# Patient Record
Sex: Female | Born: 2005
Health system: Southern US, Community
[De-identification: ages and names within clinical notes are randomized; demographics above are authoritative.]

## PROBLEM LIST (undated history)

## (undated) DIAGNOSIS — J40 Bronchitis, not specified as acute or chronic: Secondary | ICD-10-CM

## (undated) DIAGNOSIS — J302 Other seasonal allergic rhinitis: Secondary | ICD-10-CM

## (undated) DIAGNOSIS — Z20828 Contact with and (suspected) exposure to other viral communicable diseases: Secondary | ICD-10-CM

## (undated) DIAGNOSIS — S62109A Fracture of unspecified carpal bone, unspecified wrist, initial encounter for closed fracture: Secondary | ICD-10-CM

## (undated) DIAGNOSIS — N390 Urinary tract infection, site not specified: Secondary | ICD-10-CM

## (undated) HISTORY — PX: NOSE SURGERY: SHX723

## (undated) HISTORY — PX: TONSILLECTOMY: SUR1361

## (undated) HISTORY — DX: Bronchitis, not specified as acute or chronic: J40

## (undated) HISTORY — PX: TYMPANOSTOMY TUBE PLACEMENT: SHX32

## (undated) HISTORY — PX: ADENOIDECTOMY: SUR15

## (undated) HISTORY — PX: TONSILLECTOMY AND ADENOIDECTOMY: SUR1326

## (undated) HISTORY — PX: WISDOM TOOTH EXTRACTION: SHX21

---
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
## 2024-03-05 ENCOUNTER — Encounter (HOSPITAL_COMMUNITY): Payer: Self-pay

## 2024-03-05 ENCOUNTER — Emergency Department (HOSPITAL_COMMUNITY)

## 2024-03-05 ENCOUNTER — Other Ambulatory Visit: Payer: Self-pay

## 2024-03-05 ENCOUNTER — Emergency Department (HOSPITAL_COMMUNITY)
Admission: EM | Admit: 2024-03-05 | Discharge: 2024-03-05 | Disposition: A | Discharge status: Home / Self Care | Attending: Emergency Medicine | Admitting: Emergency Medicine

## 2024-03-05 DIAGNOSIS — M25562 Pain in left knee: Secondary | ICD-10-CM | POA: Diagnosis present

## 2024-03-05 DIAGNOSIS — S92351A Displaced fracture of fifth metatarsal bone, right foot, initial encounter for closed fracture: Secondary | ICD-10-CM | POA: Diagnosis not present

## 2024-03-05 DIAGNOSIS — S80212A Abrasion, left knee, initial encounter: Secondary | ICD-10-CM | POA: Diagnosis not present

## 2024-03-05 DIAGNOSIS — Y92480 Sidewalk as the place of occurrence of the external cause: Secondary | ICD-10-CM | POA: Insufficient documentation

## 2024-03-05 DIAGNOSIS — W101XXA Fall (on)(from) sidewalk curb, initial encounter: Secondary | ICD-10-CM | POA: Insufficient documentation

## 2024-03-05 MED ORDER — HYDROCODONE-ACETAMINOPHEN 5-325 MG PO TABS: 1.0000 | ORAL_TABLET | Freq: Four times a day (QID) | ORAL | 0 refills | Status: AC | PRN

## 2024-03-05 MED ORDER — TRIPLE ANTIBIOTIC 3.5-400-5000 EX OINT
TOPICAL_OINTMENT | Freq: Once | CUTANEOUS | Status: AC
Start: 2024-03-05 — End: 2024-03-05
  Filled 2024-03-05: qty 1

## 2024-03-05 MED ORDER — NAPROXEN 375 MG PO TABS: 375.0000 mg | ORAL_TABLET | Freq: Two times a day (BID) | ORAL | 0 refills | Status: AC

## 2024-03-05 NOTE — ED Triage Notes (Signed)
 Pt stated that she tripped off a curb at school. Abrasion to left knee and right ankle is swollen and bruised.

## 2024-03-05 NOTE — ED Provider Notes (Signed)
 [place] EMERGENCY DEPARTMENT AT [facility] Provider Note   CSN: [id number] Arrival date & time: 03/05/24  1459     Patient presents with: Anne [name] is a 18 y.o. female.   Patient is an 18 year old female who presents emergency department a chief complaint of pain to her left knee as well as to her right ankle and foot.  Patient notes that she tripped over a curb.  Patient denies striking her head or injuring her neck or back.  She does have an abrasion of the anterior aspect of her left knee.  She is up-to-date on her tetanus shot.  She has no other secondary signs of injury or pain at this time.   Fall       Prior to Admission medications   Medication Sig Start Date End Date Taking? Authorizing Provider  HYDROcodone -acetaminophen  (NORCO/VICODIN) 5-325 MG tablet Take 1 tablet by mouth every 6 (six) hours as needed for severe pain (pain score 7-10). 03/05/24  Yes Anne Lonni BIRCH, PA-C  naproxen  (NAPROSYN ) 375 MG tablet Take 1 tablet (375 mg total) by mouth 2 (two) times daily. 03/05/24  Yes Anne Lonni D, PA-C  acetaminophen  (TYLENOL ) 80 MG chewable tablet Chew 160 mg by mouth every 6 (six) hours as needed for mild pain or fever.    [provider]  albuterol  (VENTOLIN  HFA) 108 (90 Base) MCG/ACT inhaler Inhale 1-2 puffs into the lungs every 6 (six) hours as needed for wheezing or shortness of breath. 08/09/21   Devario Bucklew Savannah, PA-C  albuterol  (VENTOLIN  HFA) 108 (90 Base) MCG/ACT inhaler Inhale 1-2 puffs into the lungs every 6 (six) hours as needed for wheezing or shortness of breath. 08/16/21   Stuart Vernell Norris, PA-C  cetirizine (ZYRTEC) 10 MG chewable tablet Chew 10 mg by mouth daily as needed for allergies.     [provider]  doxycycline  (VIBRAMYCIN ) 100 MG capsule Take 1 capsule (100 mg total) by mouth 2 (two) times daily. 12/10/21   Triplett, Tammy, PA-C  ibuprofen  (ADVIL ,MOTRIN ) 50 MG chewable tablet Chew 100 mg by mouth every  8 (eight) hours as needed for pain or fever.    [provider]  levocetirizine (XYZAL ) 5 MG tablet Take 1 tablet (5 mg total) by mouth every evening. 08/09/21   Jazon Jipson [place], PA-C  metoCLOPramide  (REGLAN ) 10 MG tablet Take 1 tablet (10 mg total) by mouth every 6 (six) hours. 02/11/23   Yolande Lamar BROCKS, MD  ondansetron  (ZOFRAN  ODT) 4 MG disintegrating tablet 4mg  ODT q4 hours prn nausea/vomit 08/07/18   Mesner, Selinda, MD  ondansetron  (ZOFRAN  ODT) 4 MG disintegrating tablet 4mg  ODT q4 hours prn nausea/vomit 08/07/18   Mesner, Selinda, MD  predniSONE  (DELTASONE ) 20 MG tablet Take 2 tablets daily with breakfast. 08/16/21   Stuart Vernell Norris, PA-C  Probiotic Product (RA PROBIOTIC GUMMIES) CHEW Chew 1 capsule by mouth 2 (two) times daily.    [provider]  promethazine  (PHENERGAN ) 12.5 MG suppository Place 1 suppository (12.5 mg total) rectally every 6 (six) hours as needed for nausea or vomiting. Patient not taking: Reported on 09/24/2016 04/17/16   Armida Culver, PA-C    Allergies: Penicillins, Sulfamethoxazole-trimethoprim, Amoxicillin, Amoxicillin-pot clavulanate, Azithromycin , Septra [bactrim], Sulfa antibiotics, and Zofran  [ondansetron ]    Review of Systems  Musculoskeletal:        Pain to left knee and right ankle  All other systems reviewed and are negative.   Updated Vital Signs BP 129/82 (BP Location: Right Arm)  Pulse 90   Temp 98.7 F (37.1 C) (Oral)   Resp 18   Ht 5' 9 (1.753 m)   Wt 99.8 kg   SpO2 99%   BMI 32.49 kg/m   Physical Exam Vitals and nursing note reviewed.  Constitutional:      General: She is not in acute distress.    Appearance: Normal appearance. She is not ill-appearing.  HENT:     Head: Normocephalic and atraumatic.  Eyes:     Extraocular Movements: Extraocular movements intact.     Conjunctiva/sclera: Conjunctivae normal.     Pupils: Pupils are equal, round, and reactive to light.  Cardiovascular:     Rate and Rhythm: Normal rate  and regular rhythm.     Pulses: Normal pulses.     Heart sounds: Normal heart sounds.  Pulmonary:     Effort: Pulmonary effort is normal. No respiratory distress.  Musculoskeletal:        General: Normal range of motion.     Cervical back: Normal range of motion and neck supple. No rigidity or tenderness.     Comments: Tenderness palpation noted over the lateral aspect of the right ankle and foot, nontender palpation of the right knee and hip, tender to palpation noted over the left knee, nontender palpation of the left hip, ankle, foot, DP and PT pulses are 2+ distally bilateral extremities, pelvis stable to AP lower compression, full range of motion noted throughout, edema noted along the lateral aspect of the right ankle and foot, no obvious deformity, no skin breakdown or ulceration, abrasion noted to anterior aspect of the left knee  Skin:    General: Skin is warm and dry.  Neurological:     General: No focal deficit present.     Mental Status: She is alert and oriented to person, place, and time. Mental status is at baseline.  Psychiatric:        Mood and Affect: Mood normal.        Behavior: Behavior normal.        Thought Content: Thought content normal.        Judgment: Judgment normal.     (all labs ordered are listed, but only abnormal results are displayed) Labs Reviewed - No data to display  EKG: None  Radiology: DG Knee Complete 4 Views Left Result Date: 03/05/2024 CLINICAL DATA:  Anterior fall with pain. EXAM: LEFT KNEE - COMPLETE 4+ VIEW COMPARISON:  None Available. FINDINGS: No evidence of fracture, dislocation, or joint effusion. No evidence of arthropathy or other focal bone abnormality. Soft tissues are unremarkable. IMPRESSION: Negative. Electronically Signed   By: Greig Pique M.D.   On: 03/05/2024 16:29   DG Ankle Complete Right Result Date: 03/05/2024 CLINICAL DATA:  Right ankle pain after fall today. EXAM: RIGHT ANKLE - COMPLETE 3+ VIEW COMPARISON:  None  Available. FINDINGS: Minimally displaced fracture is seen involving proximal base of fifth metatarsal. No dislocation is noted. Visualized portions of distal right tibia and fibula are unremarkable. IMPRESSION: Minimally displaced proximal fifth metatarsal fracture. Electronically Signed   By: Lynwood Landy Raddle M.D.   On: 03/05/2024 15:51     Procedures   Medications Ordered in the ED  neomycin -bacitracin -polymyxin [id number] OINT ( Apply externally Given 03/05/24 1637)    Clinical Course as of 03/05/24 1701  Fri Mar 05, 2024  1636 DG Ankle Complete Right [CR]    Clinical Course User Index [CR] Anne Lonni BIRCH, PA-C  Medical Decision Making Patient is doing well at this time and is stable for discharge home.  Discussed with patient that x-ray of the right ankle does demonstrate a proximal right fifth metatarsal fracture.  She will be placed in a cam boot and recommend close follow-up with orthopedics.  X-ray of the left knee was unremarkable.  Abrasion to the left knee was cleansed and dressed at the bedside by nursing staff.  She had no lumbar joint pain noted on exam.  She did not strike her head or injure her neck or back during the fall.  She was neurovascularly intact distally.  Strict turn precautions were discussed for any new or worsening symptoms.  Patient voiced understanding and had no additional questions.  Amount and/or Complexity of Data Reviewed Radiology: ordered. Decision-making details documented in ED Course.  Risk OTC drugs. Prescription drug management.        Final diagnoses:  Displaced fracture of fifth metatarsal bone, right foot, initial encounter for closed fracture  Abrasion of left knee, initial encounter    ED Discharge Orders          Ordered    HYDROcodone -acetaminophen  (NORCO/VICODIN) 5-325 MG tablet  Every 6 hours PRN        03/05/24 1700    naproxen  (NAPROSYN ) 375 MG tablet  2 times daily         03/05/24 1700               Anne Lonni BIRCH, PA-C 03/05/24 1703    Towana Ozell BROCKS, MD 03/06/24 [phone]

## 2024-03-05 NOTE — ED Notes (Signed)
 Pt/family received d/c paperwork at this time. After going over the paperwork any questions, comments, or concerns were answered to the best of this nurse's knowledge. The pt/family verbally acknowledged the teachings/instructions.

## 2024-03-05 NOTE — Discharge Instructions (Signed)
 Please follow-up closely with orthopedics on an outpatient basis.  Return to emergency department immediately for any new or worsening symptoms.

## 2024-08-27 ENCOUNTER — Ambulatory Visit: Admitting: Physician Assistant
